# Patient Record
Sex: Male | Born: 1985 | Race: White | Hispanic: No | Marital: Single | Smoking: Light tobacco smoker
Health system: Southern US, Community
[De-identification: ages and names within clinical notes are randomized; demographics above are authoritative.]

## PROBLEM LIST (undated history)

## (undated) DIAGNOSIS — B3322 Viral myocarditis: Secondary | ICD-10-CM

## (undated) DIAGNOSIS — Z0389 Encounter for observation for other suspected diseases and conditions ruled out: Secondary | ICD-10-CM

## (undated) DIAGNOSIS — R7989 Other specified abnormal findings of blood chemistry: Secondary | ICD-10-CM

## (undated) DIAGNOSIS — I43 Cardiomyopathy in diseases classified elsewhere: Secondary | ICD-10-CM

## (undated) HISTORY — DX: Cardiomyopathy in diseases classified elsewhere: I43

## (undated) HISTORY — DX: Viral myocarditis: B33.22

## (undated) HISTORY — DX: Other specified abnormal findings of blood chemistry: R79.89

## (undated) HISTORY — DX: Encounter for observation for other suspected diseases and conditions ruled out: Z03.89

---

## 2014-02-26 ENCOUNTER — Encounter (HOSPITAL_COMMUNITY): Payer: Self-pay | Admitting: Internal Medicine

## 2014-02-26 ENCOUNTER — Inpatient Hospital Stay (HOSPITAL_COMMUNITY)
Admission: AD | Admit: 2014-02-26 | Discharge: 2014-02-28 | DRG: 287 | Disposition: A | Discharge status: Home / Self Care | Payer: BC Managed Care – PPO | Source: Other Acute Inpatient Hospital | Attending: Cardiology | Admitting: Cardiology

## 2014-02-26 DIAGNOSIS — I340 Nonrheumatic mitral (valve) insufficiency: Secondary | ICD-10-CM | POA: Diagnosis present

## 2014-02-26 DIAGNOSIS — R079 Chest pain, unspecified: Secondary | ICD-10-CM | POA: Diagnosis present

## 2014-02-26 DIAGNOSIS — I400 Infective myocarditis: Secondary | ICD-10-CM | POA: Diagnosis present

## 2014-02-26 DIAGNOSIS — R7989 Other specified abnormal findings of blood chemistry: Secondary | ICD-10-CM

## 2014-02-26 DIAGNOSIS — B3322 Viral myocarditis: Secondary | ICD-10-CM | POA: Diagnosis present

## 2014-02-26 DIAGNOSIS — I514 Myocarditis, unspecified: Secondary | ICD-10-CM

## 2014-02-26 HISTORY — DX: Viral myocarditis: B33.22

## 2014-02-26 LAB — CBC WITH DIFFERENTIAL/PLATELET
BASOS PCT: 0 % (ref 0–1)
Basophils Absolute: 0 10*3/uL (ref 0.0–0.1)
EOS ABS: 0.2 10*3/uL (ref 0.0–0.7)
EOS PCT: 3 % (ref 0–5)
HEMATOCRIT: 42.9 % (ref 39.0–52.0)
HEMOGLOBIN: 15.1 g/dL (ref 13.0–17.0)
LYMPHS ABS: 2.4 10*3/uL (ref 0.7–4.0)
LYMPHS PCT: 35 % (ref 12–46)
MCH: 32 pg (ref 26.0–34.0)
MCHC: 35.2 g/dL (ref 30.0–36.0)
MCV: 90.9 fL (ref 78.0–100.0)
Monocytes Absolute: 0.4 10*3/uL (ref 0.1–1.0)
Monocytes Relative: 6 % (ref 3–12)
NEUTROS ABS: 3.9 10*3/uL (ref 1.7–7.7)
Neutrophils Relative %: 56 % (ref 43–77)
Platelets: 261 10*3/uL (ref 150–400)
RBC: 4.72 MIL/uL (ref 4.22–5.81)
RDW: 12 % (ref 11.5–15.5)
WBC: 7 10*3/uL (ref 4.0–10.5)

## 2014-02-26 LAB — COMPREHENSIVE METABOLIC PANEL
ALK PHOS: 101 U/L (ref 39–117)
ALT: 26 U/L (ref 0–53)
AST: 30 U/L (ref 0–37)
Albumin: 3.9 g/dL (ref 3.5–5.2)
Anion gap: 8 (ref 5–15)
BILIRUBIN TOTAL: 0.4 mg/dL (ref 0.3–1.2)
BUN: 23 mg/dL (ref 6–23)
CO2: 28 mmol/L (ref 19–32)
CREATININE: 1.16 mg/dL (ref 0.50–1.35)
Calcium: 9.2 mg/dL (ref 8.4–10.5)
Chloride: 104 mEq/L (ref 96–112)
GFR calc Af Amer: >90 mL/min (ref >90)
GFR, EST NON AFRICAN AMERICAN: 85 mL/min — AB (ref >90)
GLUCOSE: 111 mg/dL — AB (ref 70–99)
Potassium: 3.8 mmol/L (ref 3.5–5.1)
Sodium: 140 mmol/L (ref 135–145)
Total Protein: 6.8 g/dL (ref 6.0–8.3)

## 2014-02-26 LAB — PROTIME-INR
INR: 1.03 (ref 0.00–1.49)
Prothrombin Time: 13.6 seconds (ref 11.6–15.2)

## 2014-02-26 LAB — TROPONIN I: Troponin I: 2.77 ng/mL (ref <0.031)

## 2014-02-26 LAB — BRAIN NATRIURETIC PEPTIDE: B Natriuretic Peptide: 18 pg/mL (ref 0.0–100.0)

## 2014-02-26 MED ORDER — SODIUM CHLORIDE 0.9 % IJ SOLN
3.0000 mL | Freq: Two times a day (BID) | INTRAMUSCULAR | Status: DC
  Administered 2014-02-26 – 2014-02-27 (×2): 3 mL via INTRAVENOUS

## 2014-02-26 MED ORDER — ZOLPIDEM TARTRATE 5 MG PO TABS: 5.0000 mg | ORAL_TABLET | Freq: Every evening | ORAL | Status: DC | PRN

## 2014-02-26 MED ORDER — ACETAMINOPHEN 325 MG PO TABS
650.0000 mg | ORAL_TABLET | ORAL | Status: DC | PRN
  Administered 2014-02-27: 650 mg via ORAL
  Filled 2014-02-26: qty 2

## 2014-02-26 MED ORDER — IBUPROFEN 600 MG PO TABS: 600.0000 mg | ORAL_TABLET | Freq: Three times a day (TID) | ORAL | Status: DC

## 2014-02-26 MED ORDER — SODIUM CHLORIDE 0.9 % IJ SOLN: 3.0000 mL | INTRAMUSCULAR | Status: DC | PRN

## 2014-02-26 MED ORDER — SODIUM CHLORIDE 0.9 % IV SOLN: 250.0000 mL | INTRAVENOUS | Status: DC | PRN

## 2014-02-26 MED ORDER — ONDANSETRON HCL 4 MG/2ML IJ SOLN: 4.0000 mg | Freq: Four times a day (QID) | INTRAMUSCULAR | Status: DC | PRN

## 2014-02-26 NOTE — Progress Notes (Signed)
Pt's troponin 2.77. Pt resting in bed and denies any CP. MD on call made aware. Will cont to monitor pt.

## 2014-02-26 NOTE — H&P (Signed)
    CARDIOLOGY ADMISSION NOTE  Assessment and Plan:   *Myocarditis: 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 Of note, patient endorses an episode of likely viral URI about 1 week ago. It is assuring that patient is currently NYHA Class 0 and has no evidence of volume overload.  -- Will cycle CE x 3 or trending down and PRO-BNP -- Monitor on tele -- Get a TTE in AM -- Consider getting cardiac MRI to rule out infiltrative processes should there be evidence of cardiomyopathy. -- Will get ANA, Coxsackie virus, HIV, RPR, hepatitis viral serologies, respiratory viral panel.  -- Avoid NSAIDs, exercise and cardiotoxic medications for now.   *FEN/GI:  -- No IVF. -- Regular diet -- No indications for GI or DVT prophylaxis  *Full Code  Chief complaint: chest pain   HPI:  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 Patient first was diagnosed with myocarditis 7 years ago after having likely a gastroenteritis. He had "some damage" to his heart at the time; although, patient is unaware of the details. He required reportedly a brief admission at the time and had NYHA class 0 functional status afterwards. He presented at the time with heavy heartbeats, which restarted about 2 days ago. He endorses mild chest pressure with every heartbeat since yesterday. Due to persistent symptoms throughout the night and today, patient went to an urgent care who transferred the [facility] to [facility] where was found to have elevated troponins to 3.03.  Besides the symptoms mentioned above, patient denies any SOB, DOE, orthopnea, LE edema, palpitations. Of note, about a week ago, patient had symptoms of upper respiratory infection with runny nose, sore throat and mild cough,  which resolved after taking Dayquil and vitamins.   He also endorses 2 other episodes of similar symptoms for which he took full dose aspirin/ibuprofen more than 1 year ago.   Cardiac history:  Myocarditis 2008 in [place] Previous cardiac imaging  EKG: pending  TTE: unclear   NM Stress test: None  Prior cath:  None  Allergies: NKDA  Social History He is single. He has a GF and a son. He endorses social smoking and drinking. No recent travels. He denies any drug use.   Family History Father with MIs Mother with HTN No family history of SCD  Physical Exam Filed Vitals:   02/26/14 1957  BP: 124/88  Pulse: 71  Temp: 97.7 F (36.5 C)  Resp: 20    Gen: comfortable in NAD. HEENT: EOMI, MMM. No evidence to conjunctivitis, no oropharyngeal erythema Neck: Supple, no JVD, CV: RRR, Norma S1,S2. No murmurs. No S3/S4, +2 pulses in bilateral UE Pulm: CTAB, Nl WOB Abdomen: Soft, NT/ND Ext: No c/c/e  Labs:  [facility] Labs Chem: 138 / 3.9 / 100/ 31 / Cr 1.22 WBC: 7.5 with 3.2% eosinophils Trop 3.03 CXR: Clear lung fields

## 2014-02-27 ENCOUNTER — Encounter (HOSPITAL_COMMUNITY): Payer: Self-pay

## 2014-02-27 ENCOUNTER — Encounter (HOSPITAL_COMMUNITY)
Admission: AD | Disposition: A | Discharge status: Home / Self Care | Payer: Self-pay | Source: Other Acute Inpatient Hospital | Attending: Cardiology

## 2014-02-27 DIAGNOSIS — R9431 Abnormal electrocardiogram [ECG] [EKG]: Secondary | ICD-10-CM

## 2014-02-27 DIAGNOSIS — R072 Precordial pain: Secondary | ICD-10-CM

## 2014-02-27 DIAGNOSIS — I059 Rheumatic mitral valve disease, unspecified: Secondary | ICD-10-CM

## 2014-02-27 HISTORY — PX: LEFT HEART CATHETERIZATION WITH CORONARY ANGIOGRAM: SHX5451

## 2014-02-27 LAB — BASIC METABOLIC PANEL
ANION GAP: 6 (ref 5–15)
Anion gap: 7 (ref 5–15)
BUN: 20 mg/dL (ref 6–23)
BUN: 15 mg/dL (ref 6–23)
CALCIUM: 9 mg/dL (ref 8.4–10.5)
CHLORIDE: 103 meq/L (ref 96–112)
CO2: 28 mmol/L (ref 19–32)
CO2: 31 mmol/L (ref 19–32)
CREATININE: 1.18 mg/dL (ref 0.50–1.35)
Calcium: 9.1 mg/dL (ref 8.4–10.5)
Chloride: 104 mEq/L (ref 96–112)
Creatinine, Ser: 1.02 mg/dL (ref 0.50–1.35)
GFR calc non Af Amer: 83 mL/min — ABNORMAL LOW (ref >90)
GLUCOSE: 106 mg/dL — AB (ref 70–99)
GLUCOSE: 112 mg/dL — AB (ref 70–99)
Potassium: 4 mmol/L (ref 3.5–5.1)
Potassium: 4.1 mmol/L (ref 3.5–5.1)
SODIUM: 139 mmol/L (ref 135–145)
Sodium: 140 mmol/L (ref 135–145)

## 2014-02-27 LAB — CK TOTAL AND CKMB (NOT AT ARMC)
CK, MB: 11.9 ng/mL (ref 0.3–4.0)
Relative Index: 4.3 — ABNORMAL HIGH (ref 0.0–2.5)
Total CK: 277 U/L — ABNORMAL HIGH (ref 7–232)

## 2014-02-27 LAB — TSH: TSH: 1.869 u[IU]/mL (ref 0.350–4.500)

## 2014-02-27 LAB — SEDIMENTATION RATE: Sed Rate: 4 mm/hr (ref 0–16)

## 2014-02-27 LAB — TROPONIN I
Troponin I: 2.87 ng/mL (ref <0.031)
Troponin I: 1.84 ng/mL (ref <0.031)

## 2014-02-27 SURGERY — LEFT HEART CATHETERIZATION WITH CORONARY ANGIOGRAM

## 2014-02-27 MED ORDER — MIDAZOLAM HCL 2 MG/2ML IJ SOLN
INTRAMUSCULAR | Status: AC
  Filled 2014-02-27: qty 2

## 2014-02-27 MED ORDER — ASPIRIN EC 81 MG PO TBEC
81.0000 mg | DELAYED_RELEASE_TABLET | Freq: Every day | ORAL | Status: DC
  Administered 2014-02-27: 81 mg via ORAL
  Filled 2014-02-27 (×2): qty 1

## 2014-02-27 MED ORDER — HEPARIN (PORCINE) IN NACL 2-0.9 UNIT/ML-% IJ SOLN
INTRAMUSCULAR | Status: AC
  Filled 2014-02-27: qty 1500

## 2014-02-27 MED ORDER — ONDANSETRON HCL 4 MG/2ML IJ SOLN: 4.0000 mg | Freq: Four times a day (QID) | INTRAMUSCULAR | Status: DC | PRN

## 2014-02-27 MED ORDER — SODIUM CHLORIDE 0.9 % IJ SOLN: 3.0000 mL | INTRAMUSCULAR | Status: DC | PRN

## 2014-02-27 MED ORDER — ACETAMINOPHEN 325 MG PO TABS: 650.0000 mg | ORAL_TABLET | ORAL | Status: DC | PRN

## 2014-02-27 MED ORDER — FENTANYL CITRATE 0.05 MG/ML IJ SOLN
INTRAMUSCULAR | Status: AC
  Filled 2014-02-27: qty 2

## 2014-02-27 MED ORDER — LISINOPRIL 2.5 MG PO TABS
2.5000 mg | ORAL_TABLET | Freq: Every day | ORAL | Status: DC
  Administered 2014-02-28: 2.5 mg via ORAL
  Filled 2014-02-27: qty 1

## 2014-02-27 MED ORDER — VERAPAMIL HCL 2.5 MG/ML IV SOLN
INTRAVENOUS | Status: AC
  Filled 2014-02-27: qty 2

## 2014-02-27 MED ORDER — HEPARIN (PORCINE) IN NACL 100-0.45 UNIT/ML-% IJ SOLN: 1000.0000 [IU]/h | INTRAMUSCULAR | Status: DC

## 2014-02-27 MED ORDER — SODIUM CHLORIDE 0.9 % IV SOLN: 250.0000 mL | INTRAVENOUS | Status: DC | PRN

## 2014-02-27 MED ORDER — HEPARIN SODIUM (PORCINE) 1000 UNIT/ML IJ SOLN
INTRAMUSCULAR | Status: AC
  Filled 2014-02-27: qty 1

## 2014-02-27 MED ORDER — SODIUM CHLORIDE 0.9 % IJ SOLN: 3.0000 mL | Freq: Two times a day (BID) | INTRAMUSCULAR | Status: DC

## 2014-02-27 MED ORDER — LISINOPRIL 2.5 MG PO TABS: 2.5000 mg | ORAL_TABLET | Freq: Every day | ORAL | Status: DC

## 2014-02-27 MED ORDER — LIDOCAINE HCL (PF) 1 % IJ SOLN
INTRAMUSCULAR | Status: AC
Start: 2014-02-27 — End: 2014-02-27
  Filled 2014-02-27: qty 30

## 2014-02-27 MED ORDER — ASPIRIN 81 MG PO CHEW
81.0000 mg | CHEWABLE_TABLET | ORAL | Status: DC
Start: 2014-02-28 — End: 2014-02-27

## 2014-02-27 MED ORDER — SODIUM CHLORIDE 0.9 % IV SOLN: INTRAVENOUS | Status: DC

## 2014-02-27 MED ORDER — SODIUM CHLORIDE 0.9 % IV SOLN
INTRAVENOUS | Status: DC
  Administered 2014-02-27: 14:00:00 via INTRAVENOUS

## 2014-02-27 MED ORDER — NITROGLYCERIN 1 MG/10 ML FOR IR/CATH LAB
INTRA_ARTERIAL | Status: AC
  Filled 2014-02-27: qty 10

## 2014-02-27 NOTE — Progress Notes (Addendum)
ANTICOAGULATION CONSULT NOTE - Initial Consult  Pharmacy Consult for Heparin Indication: chest pain/ACS  Not on File  Patient Measurements: Height: 6' (182.9 cm) Weight: 206 lb (93.441 kg) IBW/kg (Calculated) : 77.6 Heparin Dosing Weight: 96 (use TBW 93.4)  Vital Signs: Temp: 97.7 F (36.5 C) (01/01 0525) BP: 124/84 mmHg (01/01 0525) Pulse Rate: 73 (01/01 1215)  Labs:  Recent Labs  02/26/14 2146 02/27/14 0228 02/27/14 0840  HGB 15.1  --   --   HCT 42.9  --   --   PLT 261  --   --   LABPROT 13.6  --   --   INR 1.03  --   --   CREATININE 1.16 1.02  --   CKTOTAL  --   --  277*  CKMB  --   --  11.9*  TROPONINI 2.77* 2.87* 1.84*    Estimated Creatinine Clearance: 129.1 mL/min (by C-G formula based on Cr of 1.02).   Medical History: History reviewed. No pertinent past medical history.  Assessment: Pharmacy consulted to dose heparin in this 29 year old male for ACS prior to cath. Per MD, patient expected to go to cath in the next 45 minutes to 2 hours. MD wanted to give 1000 units/hr infusion with no bolus.  Called to confirm with MD. Hgb and platelets are stable.  MD will likely discontinue heparin after cath.  Goal of Therapy:  Heparin level 0.3-0.7 units/ml Monitor platelets by anticoagulation protocol: Yes   Plan:  Heparin 1000 units/hr, No bolus Follow-up plans for anticoagulation after cath if needed.  Russ Halo, PharmD Clinical Pharmacist - Resident Pager: [phone] 1/1/20161:53 PM    Addendum:  Per MD, discontinue heparin.  Cath WNL.  By looking at the [facility], it appears the heparin that I ordered was never hung. Patient was sent to cath around time of dose being ordered.

## 2014-02-27 NOTE — H&P (View-Only) (Signed)
I have reviewed echo  LVEF is approximately 45% with hypokinesis/akiensis of the disltal lateral, distal posterior and apical walls.  No pericardial effusion. Patient with minimal chest discomfort EKG with ST elevation V4 to V6, I, minimal L, III  I have reviewed case / findings with T Kelly  With above changes, would recomm ruling out coronary artery abnormality with cath  I have discussed risks/benefits with patient  He understands and agrees to proceed.  Heparin started with no bolus  ASA to be given.

## 2014-02-27 NOTE — Progress Notes (Signed)
CRITICAL VALUE ALERT  Critical value received:  CK, MB and Troponin  Date of notification:  02/27/2014  Time of notification:  11:10  Critical value read back: yes  Nurse who received alert:  Tommy Medal  MD notified (1st page):  Pricilla Riffle, MD  Time of first page:  11:10  MD notified (2nd page):  Time of second page:  Responding MD:  Pricilla Riffle, MD  Time MD responded:  11:10

## 2014-02-27 NOTE — Progress Notes (Signed)
Pt troponin 2.87. Pt resting in bed and denies any cp. Md on call paged. Will cont to monitor pt.

## 2014-02-27 NOTE — Progress Notes (Signed)
Cath with no evid for CAD or coronary anomaly. I have contacted ID for recomm on other serologies   For now D/C heparin In AM would add ACE I   Follow BP.   Possible MRI next week.  Could be done as outpatinet.

## 2014-02-27 NOTE — Progress Notes (Signed)
Echocardiogram 2D Echocardiogram has been performed.  Dorothey Baseman 02/27/2014, 10:05 AM

## 2014-02-27 NOTE — Progress Notes (Addendum)
Subjective: No SOB  Mild chest discomfort persists   Objective: Filed Vitals:   02/26/14 1957 02/27/14 0525  BP: 124/88 124/84  Pulse: 71   Temp: 97.7 F (36.5 C) 97.7 F (36.5 C)  TempSrc: Oral   Resp: 20 20  Height: 6' (1.829 m)   Weight: 212 lb (96.163 kg) 206 lb (93.441 kg)  SpO2: 98% 99%   Weight change:  No intake or output data in the 24 hours ending 02/27/14 0743  General: Alert, awake, oriented x3, in no acute distress Neck:  JVP is normal Heart: Regular rate and rhythm, without murmurs, rubs, gallops.  Lungs: Clear to auscultation.  No rales or wheezes. Exemities:  No edema.   Neuro: Grossly intact, nonfocal.  Tele  SR   Lab Results: Results for orders placed or performed during the hospital encounter of 02/26/14 (from the past 24 hour(s))  CBC WITH DIFFERENTIAL     Status: None   Collection Time: 02/26/14  9:46 PM  Result Value Ref Range   WBC 7.0 4.0 - 10.5 K/uL   RBC 4.72 4.22 - 5.81 MIL/uL   Hemoglobin 15.1 13.0 - 17.0 g/dL   HCT 16.1 09.6 - 04.5 %   MCV 90.9 78.0 - 100.0 fL   MCH 32.0 26.0 - 34.0 pg   MCHC 35.2 30.0 - 36.0 g/dL   RDW 40.9 81.1 - 91.4 %   Platelets 261 150 - 400 K/uL   Neutrophils Relative % 56 43 - 77 %   Neutro Abs 3.9 1.7 - 7.7 K/uL   Lymphocytes Relative 35 12 - 46 %   Lymphs Abs 2.4 0.7 - 4.0 K/uL   Monocytes Relative 6 3 - 12 %   Monocytes Absolute 0.4 0.1 - 1.0 K/uL   Eosinophils Relative 3 0 - 5 %   Eosinophils Absolute 0.2 0.0 - 0.7 K/uL   Basophils Relative 0 0 - 1 %   Basophils Absolute 0.0 0.0 - 0.1 K/uL  Comprehensive metabolic panel     Status: Abnormal   Collection Time: 02/26/14  9:46 PM  Result Value Ref Range   Sodium 140 135 - 145 mmol/L   Potassium 3.8 3.5 - 5.1 mmol/L   Chloride 104 96 - 112 mEq/L   CO2 28 19 - 32 mmol/L   Glucose, Bld 111 (H) 70 - 99 mg/dL   BUN 23 6 - 23 mg/dL   Creatinine, Ser 7.82 0.50 - 1.35 mg/dL   Calcium 9.2 8.4 - 95.6 mg/dL   Total Protein 6.8 6.0 - 8.3 g/dL   Albumin 3.9  3.5 - 5.2 g/dL   AST 30 0 - 37 U/L   ALT 26 0 - 53 U/L   Alkaline Phosphatase 101 39 - 117 U/L   Total Bilirubin 0.4 0.3 - 1.2 mg/dL   GFR calc non Af Amer 85 (L) >90 mL/min   GFR calc Af Amer >90 >90 mL/min   Anion gap 8 5 - 15  Protime-INR     Status: None   Collection Time: 02/26/14  9:46 PM  Result Value Ref Range   Prothrombin Time 13.6 11.6 - 15.2 seconds   INR 1.03 0.00 - 1.49  Brain natriuretic peptide     Status: None   Collection Time: 02/26/14  9:46 PM  Result Value Ref Range   B Natriuretic Peptide 18.0 0.0 - 100.0 pg/mL  Troponin I     Status: Abnormal   Collection Time: 02/26/14  9:46 PM  Result Value Ref Range  Troponin I 2.77 (HH) <0.031 ng/mL  TSH     Status: None   Collection Time: 02/26/14 10:44 PM  Result Value Ref Range   TSH 1.869 0.350 - 4.500 uIU/mL  Basic metabolic panel     Status: Abnormal   Collection Time: 02/27/14  2:28 AM  Result Value Ref Range   Sodium 139 135 - 145 mmol/L   Potassium 4.0 3.5 - 5.1 mmol/L   Chloride 104 96 - 112 mEq/L   CO2 28 19 - 32 mmol/L   Glucose, Bld 106 (H) 70 - 99 mg/dL   BUN 20 6 - 23 mg/dL   Creatinine, Ser 1.61 0.50 - 1.35 mg/dL   Calcium 9.1 8.4 - 09.6 mg/dL   GFR calc non Af Amer >90 >90 mL/min   GFR calc Af Amer >90 >90 mL/min   Anion gap 7 5 - 15  Troponin I     Status: Abnormal   Collection Time: 02/27/14  2:28 AM  Result Value Ref Range   Troponin I 2.87 (HH) <0.031 ng/mL    Studies/Results: No results found.  MedicationsReviewed   @  1.  CP  VEry mild discomfort persists.  Patient gives history of 2 days ago developing chest discomfort.  Says he can feel his heart beating  Present constantly  No exacerbating factors  Mild  SImilar to sensations he had 7 years ago.  He had 2 spells in interval time that were short lived  This episode did not go away so he came in to ER   Had URI right before Xmas Lasted a few days. No pleuritic pain  No Cough  No joint swelling  No rash  No N/V LAbs  signif for troponin elevation 2.87  Plan  Echo this AM   Repeat EKG  EKG early this am with elevation more promin laterally but somewhat diffuse  Patinet should have cardiac MR  Labs pending.  (viral, autoimmune)  LOS: 1 day   Dietrich Pates 02/27/2014, 7:43 AM

## 2014-02-27 NOTE — CV Procedure (Signed)
Roger Sullivan is a 29 y.o. male   [id number]  [id number] LOCATION:  FACILITY: [facility]  PHYSICIAN: Lennette Bihari, MD, [facility] 09-14-1985   DATE OF PROCEDURE:  02/27/2014     CARDIAC CATHETERIZATION    HISTORY:   Roger Sullivan is a 29 year old gentleman who was admitted with chest pain felt possibly due to myocarditis.  Troponin and CPK enzymes are mildly positive.  An ECG today revealed more prominent lateral ST segment changes.  An echo Doppler study showed focal wall motion abnormalities with an ejection fraction of 45%.  He is now referred for definitive cardiac catheterization to assess his coronary anatomy.   PROCEDURE:  Left heart catheterization via the right radial artery approach: Coronary angiography, left ventriculography.  The patient was brought to the second floor [facility] cath lab in the postabsorptive state. The patient was premedicated with Versed 2 mg and fentanyl 25 mcg. A right radial approach was utilized after an Allen's test verified adequate circulation. The right radial artery was punctured via the Seldinger technique, and a 6 [place] Glidesheath Slender was inserted without difficulty.  A radial cocktail consisting of Verapamil, IV nitroglycerin, and lidocaine was administered. Weight adjusted heparin was administered. A safety J wire was advanced into the ascending aorta. Diagnostic catheterization was done with a 5 [place] TIG 4.0 catheter. A 5 French pigtail catheter was used for left ventriculography. A TR radial band was applied for hemostasis. The patient left the catheterization laboratory in stable condition.   HEMODYNAMICS:   Central Aorta: 95/65  Left Ventricle: 95/7  ANGIOGRAPHY:   The left main coronary artery was angiographically normal and trifurcated into the LAD, ramus intermediate and left circumflex coronary artery.   The LAD was angiographically normal and gave rise to 2 major diagonal vessels and several septal perforating arteries.  The vessel extended to the LV apex.   The ramus intermediate artery was moderate size and angiographically normal.  The left circumflex coronary artery was anatomically normal and gave rise to two  obtuse marginal branches.   The RCA was angiographically normal it gave rise to a large PDA and PLA vessel.   Left ventriculography revealed mild global LV dysfunction with slightly more pronounced mid anterolateral hypocontractility and apical anterior hypocontractility.  Ejection fraction is 45-50%.   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  Normal coronary arteries.   RECOMMENDATION:  Viral serologies have been sent.  Infectious disease will be consulted.  He will be started on ACE inhibitor therapy.      Lennette Bihari, MD, [facility] 02/27/2014 1:18 PM

## 2014-02-27 NOTE — Interval H&P Note (Signed)
Cath Lab Visit (complete for each Cath Lab visit)  Clinical Evaluation Leading to the Procedure:   ACS: Yes.    Non-ACS:    Anginal Classification: CCS IV  Anti-ischemic medical therapy: No Therapy  Non-Invasive Test Results: No non-invasive testing performed  Prior CABG: No previous CABG      History and Physical Interval Note:  02/27/2014 12:31 PM  Roger Sullivan  has presented today for surgery, with the diagnosis of Non Stemi  The various methods of treatment have been discussed with the patient and family. After consideration of risks, benefits and other options for treatment, the patient has consented to  Procedure(s): LEFT HEART CATHETERIZATION WITH CORONARY ANGIOGRAM as a surgical intervention .  The patient's history has been reviewed, patient examined, no change in status, stable for surgery.  I have reviewed the patient's chart and labs.  Questions were answered to the patient's satisfaction.     Jeremie Giangrande A

## 2014-02-27 NOTE — Progress Notes (Signed)
I have reviewed echo  LVEF is approximately 45% with hypokinesis/akiensis of the disltal lateral, distal posterior and apical walls.  No pericardial effusion. Patient with minimal chest discomfort EKG with ST elevation V4 to V6, I, minimal L, III  I have reviewed case / findings with T Kelly  With above changes, would recomm ruling out coronary artery abnormality with cath  I have discussed risks/benefits with patient  He understands and agrees to proceed.  Heparin started with no bolus  ASA to be given.   

## 2014-02-28 ENCOUNTER — Other Ambulatory Visit: Payer: Self-pay | Admitting: Physician Assistant

## 2014-02-28 DIAGNOSIS — I409 Acute myocarditis, unspecified: Secondary | ICD-10-CM

## 2014-02-28 DIAGNOSIS — I514 Myocarditis, unspecified: Secondary | ICD-10-CM

## 2014-02-28 LAB — BASIC METABOLIC PANEL
ANION GAP: 6 (ref 5–15)
BUN: 13 mg/dL (ref 6–23)
CHLORIDE: 102 meq/L (ref 96–112)
CO2: 31 mmol/L (ref 19–32)
Calcium: 9.1 mg/dL (ref 8.4–10.5)
Creatinine, Ser: 1.05 mg/dL (ref 0.50–1.35)
GFR calc Af Amer: >90 mL/min (ref >90)
Glucose, Bld: 114 mg/dL — ABNORMAL HIGH (ref 70–99)
Potassium: 3.8 mmol/L (ref 3.5–5.1)
Sodium: 139 mmol/L (ref 135–145)

## 2014-02-28 LAB — TROPONIN I: Troponin I: 1.49 ng/mL (ref <0.031)

## 2014-02-28 MED ORDER — PANTOPRAZOLE SODIUM 40 MG PO TBEC: 40.0000 mg | DELAYED_RELEASE_TABLET | Freq: Every day | ORAL | Status: DC

## 2014-02-28 MED ORDER — IBUPROFEN 600 MG PO TABS
600.0000 mg | ORAL_TABLET | Freq: Four times a day (QID) | ORAL | Status: DC
  Administered 2014-02-28: 600 mg via ORAL
  Filled 2014-02-28: qty 1

## 2014-02-28 MED ORDER — COLCHICINE 0.6 MG PO TABS: 0.6000 mg | ORAL_TABLET | Freq: Two times a day (BID) | ORAL | Status: DC

## 2014-02-28 MED ORDER — COLCHICINE 0.6 MG PO TABS
0.6000 mg | ORAL_TABLET | Freq: Two times a day (BID) | ORAL | Status: DC
  Administered 2014-02-28: 0.6 mg via ORAL
  Filled 2014-02-28: qty 1

## 2014-02-28 MED ORDER — LISINOPRIL 2.5 MG PO TABS: 2.5000 mg | ORAL_TABLET | Freq: Every day | ORAL | Status: DC

## 2014-02-28 MED ORDER — IBUPROFEN 600 MG PO TABS: 600.0000 mg | ORAL_TABLET | Freq: Four times a day (QID) | ORAL | Status: AC

## 2014-02-28 NOTE — Discharge Instructions (Signed)
No lifting over 5 lbs for 1 week. No sexual activity for 1 week. You may return to work on 03/02/2013, however no strenuous activity until cleared by cardiologist. Keep procedure site clean & dry. If you notice increased pain, swelling, bleeding or pus, call/return!  You may shower, but no soaking baths/hot tubs/pools for 1 week.

## 2014-02-28 NOTE — Progress Notes (Signed)
Patient ID: Roger Sullivan, male   DOB: May 26, 1985, 29 y.o.   MRN: [id number]   Subjective: Forceful heart beating no chest pain or dyspnea  Objective: Filed Vitals:   02/27/14 1454 02/27/14 1511 02/27/14 2000 02/28/14 0500  BP: 104/55 104/51 127/68 124/56  Pulse:   54 66  Temp:   97.8 F (36.6 C) 97.5 F (36.4 C)  TempSrc:   Oral Oral  Resp: Height:      Weight:    96.072 kg (211 lb 12.8 oz)  SpO2:   97% 98%   Weight change: -0.091 kg (-3.2 oz)  Intake/Output Summary (Last 24 hours) at 02/28/14 0751 Last data filed at 02/27/14 1730  Gross per 24 hour  Intake    480 ml  Output      0 ml  Net    480 ml    General: Alert, awake, oriented x3, in no acute distress Neck:  JVP is normal Heart: Regular rate and rhythm, without murmurs, rubs, gallops.  Lungs: Clear to auscultation.  No rales or wheezes. Exemities:  No edema.   Neuro: Grossly intact, nonfocal.  Tele  SR   Lab Results: Results for orders placed or performed during the hospital encounter of 02/26/14 (from the past 24 hour(s))  Troponin I     Status: Abnormal   Collection Time: 02/27/14  8:40 AM  Result Value Ref Range   Troponin I 1.84 (HH) <0.031 ng/mL  Sedimentation rate     Status: None   Collection Time: 02/27/14  8:40 AM  Result Value Ref Range   Sed Rate 4 0 - 16 mm/hr  CK total and CKMB (cardiac)     Status: Abnormal   Collection Time: 02/27/14  8:40 AM  Result Value Ref Range   Total CK 277 (H) 7 - 232 U/L   CK, MB 11.9 (HH) 0.3 - 4.0 ng/mL   Relative Index 4.3 (H) 0.0 - 2.5  Basic metabolic panel     Status: Abnormal   Collection Time: 02/27/14  3:37 PM  Result Value Ref Range   Sodium 140 135 - 145 mmol/L   Potassium 4.1 3.5 - 5.1 mmol/L   Chloride 103 96 - 112 mEq/L   CO2 31 19 - 32 mmol/L   Glucose, Bld 112 (H) 70 - 99 mg/dL   BUN 15 6 - 23 mg/dL   Creatinine, Ser 7.84 0.50 - 1.35 mg/dL   Calcium 9.0 8.4 - 69.6 mg/dL   GFR calc non Af Amer 83 (L) >90 mL/min   GFR calc Af  Amer >90 >90 mL/min   Anion gap 6 5 - 15  Basic metabolic panel     Status: Abnormal   Collection Time: 02/28/14  3:45 AM  Result Value Ref Range   Sodium 139 135 - 145 mmol/L   Potassium 3.8 3.5 - 5.1 mmol/L   Chloride 102 96 - 112 mEq/L   CO2 31 19 - 32 mmol/L   Glucose, Bld 114 (H) 70 - 99 mg/dL   BUN 13 6 - 23 mg/dL   Creatinine, Ser 2.95 0.50 - 1.35 mg/dL   Calcium 9.1 8.4 - 28.4 mg/dL   GFR calc non Af Amer >90 >90 mL/min   GFR calc Af Amer >90 >90 mL/min   Anion gap 6 5 - 15  Troponin I     Status: Abnormal   Collection Time: 02/28/14  3:45 AM  Result Value Ref Range   Troponin I 1.49 (HH) <  0.031 ng/mL    Studies/Results: No results found.  MedicationsReviewed   @  Chest Pain: ? Myocarditis no CAD at cath.  Telemetry with no PVC;s or NSVT.  HIV and viral serologies pending.  D/C home with colchicine  Bid and NSAI's.  Needs outpatient cardiac MRI scheduled next week for me to read F/U Dr Tenny Craw as outpatient  Start lisinopril per PR for decreased EF   daily    LOS: 2 days   Charlton Haws 02/28/2014, 7:51 AM

## 2014-02-28 NOTE — Discharge Summary (Signed)
Discharge Summary   Patient ID: Roger Sullivan,  MRN: [id number], DOB/AGE: 1985-12-06 29 y.o.  Admit date: 02/26/2014 Discharge date: 02/28/2014  Primary Care Provider: [facility] Primary Cardiologist: Dr. Tenny Craw  Discharge Diagnoses Active Problems:   Myocarditis   Allergies No Known Allergies  Procedures  Echocardiogram 02/27/2014 LV EF: 45% -  50%  ------------------------------------------------------------------- Indications:   Myocarditis - acute 422.90.  ------------------------------------------------------------------- History:  PMH: No prior cardiac history.  ------------------------------------------------------------------- Study Conclusions  - Left ventricle: global LV strain is -20% LVEF is approximately 45% with hypokinesis of the distal lateral, mid and distal anterolateral and apical walls. The cavity size was normal. Wall thickness was normal. Systolic function was mildly reduced. The estimated ejection fraction was in the range of 45% to 50%. Diffuse hypokinesis. There is severe hypokinesis of the mid-apicalanterolateral myocardium. There is moderate hypokinesis of the apical myocardium. - Aortic valve: Mildly calcified leaflets. - Mitral valve: There was mild regurgitation.    Cardiac catheterization 02/27/2014  HEMODYNAMICS:   Central Aorta: 95/65  Left Ventricle: 95/7  ANGIOGRAPHY:   The left main coronary artery was angiographically normal and trifurcated into the LAD, ramus intermediate and left circumflex coronary artery.   The LAD was angiographically normal and gave rise to 2 major diagonal vessels and several septal perforating arteries. The vessel extended to the LV apex.   The ramus intermediate artery was moderate size and angiographically normal.  The left circumflex coronary artery was anatomically normal and gave rise to two obtuse marginal branches.   The RCA was angiographically normal it gave rise to a  large PDA and PLA vessel.   Left ventriculography revealed mild global LV dysfunction with slightly more pronounced mid anterolateral hypocontractility and apical anterior hypocontractility. Ejection fraction is 45-50%.  IMPRESSION:  Mild LV dysfunction, most likely due to probable viral myocarditis with an ejection fraction of approximate 45-50% with focal wall motion abnormality involving the mid anterolateral wall and apical anterior segment.  Normal coronary arteries.   RECOMMENDATION:  Viral serologies have been sent. Infectious disease will be consulted. He will be started on ACE inhibitor therapy.    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 from [facility] on the night of 12/31 with 2 day onset of chest pain with clinical symptoms suggesting recurrent myocarditis. He endorsed episode of viral URI 1 week ago. It appears, he initially saught medical attention at urgent care for worsening symptom who referred him to [facility] who found to have elevated troponin of 3.03. After transfer to [facility], serial troponin was obtained which showed troponin of 2.77. His troponin subsequently trended up to 2.87 before coming down. ANA and respiratory virus panel have been obtained and is currently pending. TSH was normal.  Echocardiogram showed EF 45% with hypokinesis of distal lateral, mid, and distal anterolateral and apical wall, mild MR. Given wall motion abnormality seen on echo, the case was discussed with interventional team, and it eventually was decided for him to undergo cardiac catheterization to definitively rule out underlying coronary artery disease.  He underwent cardiac cath on 02/27/2014 which showed EF 45-50%, normal coronaries without angiographically significant coronary artery disease, focal wall motion abnormality involving mid anterolateral and  apical anterior segment of the myocardium. Patient's condition was felt to be related to viral myocarditis. He was seen in the morning of 02/28/2014 at which time he denies any significant chest discomfort or shortness  breath. He is deemed stable for discharge from cardiology perspective. I will contact cardiology office to arrange outpatient cardiac MRI to be read by Dr. Eden Emms next week. Patient will follow-up with Dr. Tenny Craw in the clinic. I will add BID dose of colchicine and NSAIDs up discharge. I will also add PPI for GI protection while on NSAID.  Discharge Vitals Blood pressure 124/56, pulse 66, temperature 97.5 F (36.4 C), temperature source Oral, resp. rate 17, height 6' (1.829 m), weight 211 lb 12.8 oz (96.072 kg), SpO2 98 %.  Filed Weights   02/26/14 1957 02/27/14 0525 02/28/14 0500  Weight: 212 lb (96.163 kg) 206 lb (93.441 kg) 211 lb 12.8 oz (96.072 kg)    Labs  CBC  Recent Labs  02/26/14 2146  WBC 7.0  NEUTROABS 3.9  HGB 15.1  HCT 42.9  MCV 90.9  PLT 261   Basic Metabolic Panel  Recent Labs  02/27/14 1537 02/28/14 0345  NA 140 139  K 4.1 3.8  CL 103 102  CO2 31 31  GLUCOSE 112* 114*  BUN 15 13  CREATININE 1.18 1.05  CALCIUM 9.0 9.1   Liver Function Tests  Recent Labs  02/26/14 2146  AST 30  ALT 26  ALKPHOS 101  BILITOT 0.4  PROT 6.8  ALBUMIN 3.9   Cardiac Enzymes  Recent Labs  02/27/14 0228 02/27/14 0840 02/28/14 0345  CKTOTAL  --  277*  --   CKMB  --  11.9*  --   TROPONINI 2.87* 1.84* 1.49*   Thyroid Function Tests  Recent Labs  02/26/14 2244  TSH 1.869    Disposition  Pt is being discharged home today in good condition.  Follow-up Plans & Appointments      Follow-up Information    Follow up with Dietrich Pates, MD.   Specialty:  Cardiology   Why:  Office will contact you to arrange followup with Dr. Tenny Craw after obtain outpatient cardiac MRI   Contact information:   [address] [place]  [address] [phone]       Follow up with [facility].   Specialty:  Cardiology   Why:  Office will contact you to arrange outpatient cardiac MRI next week.    Contact information:   [address], [address] [place] [address] [phone]      Discharge Medications    Medication List    STOP taking these medications        aspirin EC 325 MG tablet      TAKE these medications        colchicine 0.6 MG tablet  Take 1 tablet (0.6 mg total) by mouth 2 (two) times daily.     ibuprofen 600 MG tablet  Commonly known as:  ADVIL,MOTRIN  Take 1 tablet (600 mg total) by mouth 4 (four) times daily.     lisinopril 2.5 MG tablet  Commonly known as:  PRINIVIL,ZESTRIL  Take 1 tablet (2.5 mg total) by mouth daily.     multivitamin with minerals Tabs tablet  Take 1 tablet by mouth daily.        Outstanding Labs/Studies  Outpatient cardiac MRI  Duration of Discharge Encounter   Greater than 30 minutes including physician time.  Ramond Dial PA-C Pager: [phone] 02/28/2014, 8:44 AM

## 2014-03-01 ENCOUNTER — Telehealth: Payer: Self-pay | Admitting: Physician Assistant

## 2014-03-01 LAB — HEPATITIS PANEL, ACUTE
HCV Ab: NEGATIVE
HEP B C IGM: NONREACTIVE
Hep A IgM: NONREACTIVE
Hepatitis B Surface Ag: NEGATIVE

## 2014-03-01 LAB — RPR

## 2014-03-01 LAB — HIV ANTIBODY (ROUTINE TESTING W REFLEX): HIV: NONREACTIVE

## 2014-03-01 NOTE — Telephone Encounter (Signed)
Patient contacted the after hour service line complaining of pain in R arm after recent cath. When I called back, phone line was busy after calling twice. I will attempt again later.   Ramond Dial PA Pager: [phone]

## 2014-03-02 ENCOUNTER — Telehealth: Payer: Self-pay | Admitting: Internal Medicine

## 2014-03-02 LAB — RESPIRATORY VIRUS PANEL
ADENOVIRUS: NOT DETECTED
INFLUENZA A: NOT DETECTED
Influenza A H1: NOT DETECTED
Influenza A H3: NOT DETECTED
Influenza B: NOT DETECTED
Metapneumovirus: NOT DETECTED
PARAINFLUENZA 2 A: NOT DETECTED
PARAINFLUENZA 3 A: NOT DETECTED
Parainfluenza 1: NOT DETECTED
Respiratory Syncytial Virus A: NOT DETECTED
Respiratory Syncytial Virus B: NOT DETECTED
Rhinovirus: NOT DETECTED

## 2014-03-02 LAB — ANA: ANA: NEGATIVE

## 2014-03-02 NOTE — Telephone Encounter (Signed)
Called to follow up with pt. Got recording saying "person you are trying reach cannot be reached at this time".  Unable to leave message.

## 2014-03-02 NOTE — Telephone Encounter (Signed)
New message   Patient called the after hour voice mail on yesterday.   C/o weird feeling in right arm .  Due to recent procedure.

## 2014-03-03 ENCOUNTER — Telehealth: Payer: Self-pay | Admitting: *Deleted

## 2014-03-03 LAB — COXSACKIE B VIRUS ANTIBODIES
Coxsackie B1 Ab: 1:8 {titer} — ABNORMAL HIGH
Coxsackie B2 Ab: 1:16 {titer} — ABNORMAL HIGH
Coxsackie B3 Ab: 1:8 {titer} — ABNORMAL HIGH
Coxsackie B4 Ab: <1:8 {titer}
Coxsackie B6 Ab: 1:16 {titer} — ABNORMAL HIGH

## 2014-03-03 LAB — MYCOPLASMA PNEUMONIAE ANTIBODY, IGM: MYCOPLASMA PNEUMO IGM: 489 U/mL (ref <770)

## 2014-03-03 NOTE — Telephone Encounter (Signed)
PA for Colchicine sent via CoverMyMeds.

## 2014-03-04 ENCOUNTER — Telehealth: Payer: Self-pay | Admitting: Cardiovascular Disease

## 2014-03-04 ENCOUNTER — Encounter: Payer: Self-pay | Admitting: Cardiovascular Disease

## 2014-03-04 LAB — COXSACKIE A VIRUS ANTIBODIES

## 2014-03-04 NOTE — Telephone Encounter (Signed)
Called patient and gave him appt date and time for cardiac MRI at [facility].  Also gave him the address for his 03-09-14 appt with Franky MachoLuke and also phone number.  Mailed MRI letter today.

## 2014-03-04 NOTE — Telephone Encounter (Signed)
Spoke with patient. Right hand / arm neurovascular status intact. Good sensation, color wnl, hand is not cool/cold. Has bruising around cath site, this area is warm to touch.   There is no redness at insertion site. At the time he called in he had felt some occasional tingling to this arm, which has resolved.  He is waiting to get colchicine- it needed prior auth. Received phone call yesterday and was told it was submitted and will take up to 72 hr to hear back. He continues to take ibuprofen.

## 2014-03-05 LAB — ADENOVIRUS ANTIBODIES: Adenovirus Antibody: 1:16 {titer}

## 2014-03-06 ENCOUNTER — Telehealth: Payer: Self-pay | Admitting: Cardiology

## 2014-03-06 NOTE — Telephone Encounter (Signed)
New message      Calling to check the status on the prior authorization on his colchicine.  Please call

## 2014-03-06 NOTE — Telephone Encounter (Signed)
PA for colchicine denied.

## 2014-03-06 NOTE — Telephone Encounter (Signed)
Spoke with patient about denial and that Dr. Jens Somrenshaw has been forwarded the information.

## 2014-03-09 ENCOUNTER — Ambulatory Visit (INDEPENDENT_AMBULATORY_CARE_PROVIDER_SITE_OTHER): Payer: BLUE CROSS/BLUE SHIELD | Admitting: Cardiology

## 2014-03-09 ENCOUNTER — Telehealth: Payer: Self-pay | Admitting: Cardiology

## 2014-03-09 ENCOUNTER — Encounter: Payer: Self-pay | Admitting: Cardiology

## 2014-03-09 VITALS — BP 120/64 | HR 65 | Ht 72.0 in | Wt 209.0 lb

## 2014-03-09 DIAGNOSIS — B3322 Viral myocarditis: Secondary | ICD-10-CM

## 2014-03-09 DIAGNOSIS — R778 Other specified abnormalities of plasma proteins: Secondary | ICD-10-CM | POA: Insufficient documentation

## 2014-03-09 DIAGNOSIS — IMO0001 Reserved for inherently not codable concepts without codable children: Secondary | ICD-10-CM

## 2014-03-09 DIAGNOSIS — I43 Cardiomyopathy in diseases classified elsewhere: Secondary | ICD-10-CM

## 2014-03-09 DIAGNOSIS — Z0389 Encounter for observation for other suspected diseases and conditions ruled out: Secondary | ICD-10-CM

## 2014-03-09 DIAGNOSIS — R7989 Other specified abnormal findings of blood chemistry: Secondary | ICD-10-CM

## 2014-03-09 HISTORY — DX: Other specified abnormalities of plasma proteins: R77.8

## 2014-03-09 HISTORY — DX: Reserved for inherently not codable concepts without codable children: IMO0001

## 2014-03-09 HISTORY — DX: Cardiomyopathy in diseases classified elsewhere: I43

## 2014-03-09 NOTE — Progress Notes (Signed)
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 He has a history of presumed viral pericarditis 7 years ago. He was [facility] on the night of 12/31 with 2 day history of chest pain with clinical symptoms suggesting myocarditis. He endorsed episode of viral URI 1 week ago. It appears, he initially saught medical attention at urgent care for worsening symptom who referred him [facility] who found to have elevated troponin of 3.03. After transfer to [facility], serial troponin was obtained which showed troponin of 2.77. TSH was normal. Echocardiogram showed EF 45% with hypokinesis of distal lateral, mid, and distal anterolateral and apical wall, mild MR. Given wall motion abnormality seen on echo, he underwent cardiac cath on 02/27/2014 which showed EF 45-50%, and normal coronaries with focal wall motion abnormality involving mid anterolateral and apical anterior segment of the myocardium.            He was discharged on NSAIDs and colchicine and is here for follow up. His viral studies were positive for coxsackie virus. ANA and sed rate were WNL. He is scheduled to have a cardiac MRI this week. His insurance company apparently would not cover colchicine. He is doing well since discharge. He denies any unusual dyspnea. 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 He still has some chest discomfort but it improving.  Current Outpatient Prescriptions  Medication Sig Dispense Refill  . ibuprofen (ADVIL,MOTRIN) 600 MG tablet Take 1 tablet (600 mg total) by mouth 4 (four) times daily. 56 tablet 0  . lisinopril (PRINIVIL,ZESTRIL) 2.5 MG tablet Take 1 tablet (2.5 mg total) by mouth daily. 30 tablet 5  . Multiple Vitamin (MULTIVITAMIN WITH MINERALS) TABS tablet Take 1 tablet by mouth daily.    . colchicine 0.6 MG  tablet Take 1 tablet (0.6 mg total) by mouth 2 (two) times daily. (Patient not taking: Reported on 03/09/2014) 60 tablet 2   No current facility-administered medications for this visit.    No Known Allergies  History   Social History  . Marital Status: Single    Spouse Name: N/A    Number of Children: N/A  . Years of Education: N/A   Occupational History  . Not on file.   Social History Main Topics  . Smoking status: Light Tobacco Smoker  . Smokeless tobacco: Never Used     Comment: social, very rare  . Alcohol Use: 0.0 oz/week    0 Not specified per week     Comment: social  . Drug Use: No  . Sexual Activity: Not on file   Other Topics Concern  . Not on file   Social History Narrative     Review of Systems: General: negative for chills, fever, night sweats or weight changes.  Cardiovascular: negative for chest pain, dyspnea on exertion, edema, orthopnea, palpitations, paroxysmal nocturnal dyspnea or shortness of breath Dermatological: negative for rash Respiratory: negative for cough or wheezing Urologic: negative for hematuria Abdominal: negative for nausea, vomiting, diarrhea, bright red blood per rectum, melena, or hematemesis Neurologic: negative for visual changes, syncope, or dizziness All other systems reviewed and are otherwise negative except as noted above.    Blood pressure 120/64, pulse 65, height 6' (1.829 m), weight 209 lb (94.802 kg).  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 F/U with Dr Roger Sullivan after your MRI. Continue NSAIDs.  Roger Sullivan KPA-C 03/09/2014 1:19 PM

## 2014-03-09 NOTE — Assessment & Plan Note (Signed)
Coxsackie AB titers elevated

## 2014-03-09 NOTE — Assessment & Plan Note (Signed)
EF 45-50%.  

## 2014-03-09 NOTE — Assessment & Plan Note (Signed)
Cath 02/27/14

## 2014-03-09 NOTE — Telephone Encounter (Signed)
Patient was seen today.

## 2014-03-09 NOTE — Telephone Encounter (Signed)
New msg         Pt hasn't been seen in the office as of yet, chart shows Dr. Patty SermonsBrackbill may have seen pt in hosp.   Pt calling to inform he may be 10 min late for appt at 11:30 with Corine ShelterLuke Sullivan because of traffic.

## 2014-03-09 NOTE — Patient Instructions (Signed)
Your physician recommends that you continue on your current medications as directed. Please refer to the Current Medication list given to you today.     FOLLOW UP WITH DR ROSS IN 3 TO 4 WEEKS

## 2014-03-12 ENCOUNTER — Ambulatory Visit (HOSPITAL_COMMUNITY)
Admission: RE | Admit: 2014-03-12 | Discharge: 2014-03-12 | Disposition: A | Discharge status: Home / Self Care | Payer: BLUE CROSS/BLUE SHIELD | Source: Ambulatory Visit | Attending: Physician Assistant | Admitting: Physician Assistant

## 2014-03-12 DIAGNOSIS — I409 Acute myocarditis, unspecified: Secondary | ICD-10-CM

## 2014-03-12 DIAGNOSIS — I319 Disease of pericardium, unspecified: Secondary | ICD-10-CM | POA: Insufficient documentation

## 2014-03-12 MED ORDER — GADOBENATE DIMEGLUMINE 529 MG/ML IV SOLN
32.0000 mL | Freq: Once | INTRAVENOUS | Status: AC | PRN
  Administered 2014-03-12: 32 mL via INTRAVENOUS

## 2014-03-13 ENCOUNTER — Other Ambulatory Visit (HOSPITAL_COMMUNITY): Payer: Self-pay

## 2014-04-08 NOTE — Progress Notes (Signed)
Cardiology Office Note   Date:  04/10/2014   ID:  Roger Sullivan, DOB 10/02/1985, MRN [id number]  PCP:  Lorelei PontMAHONEY,MARK, DO  Cardiologist:   Dietrich PatesPaula Tammela Bales, MD   No chief complaint on file.     History of Present Illness: Roger Sullivan is a 29 y.o. male with a history of presumed viral pericarditis 7 years ago. He was [facility] on the night of 12/31 with 2 day history of chest pain with clinical symptoms suggesting myocarditis. He endorsed episode of viral URI 1 week ago. It appears, he initially saught medical attention at urgent care for worsening symptom who referred him [facility] who found to have elevated troponin of 3.03. After transfer to [facility], serial troponin was obtained which showed troponin of 2.77. TSH was normal. Echocardiogram showed EF 45% with hypokinesis of distal lateral, mid, and distal anterolateral and apical wall, mild MR. Given wall motion abnormality seen on echo, he underwent cardiac cath on 02/27/2014 which showed EF 45-50%, and normal coronaries with focal wall motion abnormality involving mid anterolateral and apical anterior segment of the myocardium.  He was discharged on NSAIDs and colchicine and is here for follow up. His viral studies were positive for coxsackie virus. ANA and sed rate were WNL. 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 lisinopril (PRINIVIL,ZESTRIL) 2.5 MG tablet Take 1 tablet (2.5 mg total) by mouth daily. 30 tablet 5  . Multiple Vitamin (MULTIVITAMIN WITH MINERALS) TABS tablet Take 1 tablet by mouth daily.    . colchicine 0.6 MG tablet Take 1 tablet (0.6 mg total) by mouth 2 (two) times daily.  (Patient not taking: Reported on 03/09/2014) 60 tablet 2   No current facility-administered medications for this visit.    Allergies:   Review of patient's allergies indicates no known allergies.   No past medical history on file.  Past Surgical History  Procedure Laterality Date  . Left heart catheterization with coronary angiogram  02/27/2014    Procedure: LEFT HEART CATHETERIZATION WITH CORONARY ANGIOGRAM;  Surgeon: Lennette Biharihomas A Kelly, MD;  [facility] C[facility];  Service: Cardiovascular;;     Social History:  The patient  reports that he has been smoking.  He has never used smokeless tobacco. He reports that he drinks alcohol. He reports that he does not use illicit drugs.   Family History:  The patient's family history includes Cirrhosis in an other family member; Heart attack in an other family member; Hypertension in an other family member; Stroke in an other family member.    ROS:  Please see the history of present illness. All other systems are reviewed and  Negative to the above problem except as noted.    PHYSICAL EXAM: VS:  BP 120/80 mmHg  Pulse 70  Ht 6' (1.829 m)  Wt 212 lb (96.163 kg)  BMI 28.75 kg/m2  SpO2 97%  GEN: Well nourished, well developed, in no acute distress HEENT: normal Neck: no JVD, carotid bruits, or masses Cardiac: RRR; no murmurs, rubs, or gallops,no edema  Respiratory:  clear to auscultation bilaterally, normal work of breathing  GI: soft, nontender, nondistended, + BS  No hepatomegaly  MS: no deformity Moving all extremities   Skin: warm and dry, no rash Neuro:  Strength and sensation are intact Psych: euthymic mood, full affect   EKG:  EKG was not done      Lipid Panel No results found for: CHOL, TRIG, HDL, CHOLHDL, VLDL, LDLCALC, LDLDIRECT    Wt Readings from Last 3 Encounters:  04/10/14 212 lb (96.163 kg)  03/09/14 209 lb (94.802 kg)  02/28/14 211 lb 12.8 oz (96.072 kg)      ASSESSMENT AND PLAN: 1.  Myopericarditis. Patinet has  recovered well  MRI showed normal LV systolic function I will review with ID  No changes for now.  OK to exercise      Current medicines are reviewed at length with the patient today.  The patient does not have concerns re meds    The following changes have been made: N ochanges    Labs/ tests ordered today include  No tests   Disposition:   Will call patient after I have spoken with ID  No f/u set for now but will need to address ACE I  Signed, Dietrich Pates, MD  04/10/2014 10:47 AM    [facility] [address] [place], [place], [place]  [address] Phone: [phone]; Fax: [phone]

## 2014-04-10 ENCOUNTER — Ambulatory Visit (INDEPENDENT_AMBULATORY_CARE_PROVIDER_SITE_OTHER): Payer: BLUE CROSS/BLUE SHIELD | Admitting: Internal Medicine

## 2014-04-10 ENCOUNTER — Encounter: Payer: Self-pay | Admitting: Internal Medicine

## 2014-04-10 VITALS — BP 120/80 | HR 70 | Ht 72.0 in | Wt 212.0 lb

## 2014-04-10 DIAGNOSIS — I309 Acute pericarditis, unspecified: Secondary | ICD-10-CM

## 2014-04-10 NOTE — Patient Instructions (Signed)
Your physician recommends that you schedule a follow-up appointment AS NEEDED WITH DR ROSS.

## 2014-05-08 ENCOUNTER — Telehealth: Payer: Self-pay | Admitting: *Deleted

## 2014-05-08 NOTE — Telephone Encounter (Signed)
Needs appt for Sept     Keep on lisinopril for now.         Message above from Dr. Tenny Crawoss. Left message on voice mail for patient to call to verify he is still taking lisinopril for now.  Recall placed for September.

## 2014-05-28 NOTE — Telephone Encounter (Signed)
Spoke with patient. He is feeling well and has been taking care of himself. He never picked up a refill for the lisinopril after the first prescription.

## 2014-05-30 NOTE — Telephone Encounter (Signed)
Will be available as needed.

## 2014-11-30 ENCOUNTER — Ambulatory Visit: Payer: BLUE CROSS/BLUE SHIELD | Admitting: Internal Medicine

## 2015-01-25 ENCOUNTER — Ambulatory Visit: Payer: BLUE CROSS/BLUE SHIELD | Admitting: Internal Medicine

## 2015-02-05 ENCOUNTER — Telehealth: Payer: Self-pay

## 2015-02-05 ENCOUNTER — Ambulatory Visit (INDEPENDENT_AMBULATORY_CARE_PROVIDER_SITE_OTHER): Payer: BLUE CROSS/BLUE SHIELD | Admitting: Internal Medicine

## 2015-02-05 ENCOUNTER — Encounter: Payer: Self-pay | Admitting: Internal Medicine

## 2015-02-05 VITALS — BP 116/74 | HR 69 | Ht 72.0 in | Wt 205.0 lb

## 2015-02-05 DIAGNOSIS — R0789 Other chest pain: Secondary | ICD-10-CM

## 2015-02-05 DIAGNOSIS — Z8679 Personal history of other diseases of the circulatory system: Secondary | ICD-10-CM | POA: Diagnosis not present

## 2015-02-05 LAB — TROPONIN I

## 2015-02-05 MED ORDER — COLCHICINE 0.6 MG PO CAPS: 0.6000 mg | ORAL_CAPSULE | Freq: Two times a day (BID) | ORAL | Status: DC

## 2015-02-05 MED ORDER — COLCHICINE 0.6 MG PO CAPS: 0.6000 mg | ORAL_CAPSULE | Freq: Two times a day (BID) | ORAL | Status: AC

## 2015-02-05 NOTE — Patient Instructions (Signed)
Your physician has recommended you make the following change in your medication:  1.) colchicine 0.6 mg twice daily  Your physician recommends that you return for lab work in: today (TROPONINS)  Your physician has requested that you have an echocardiogram. Echocardiography is a painless test that uses sound waves to create images of your heart. It provides your doctor with information about the size and shape of your heart and how well your heart's chambers and valves are working. This procedure takes approximately one hour. There are no restrictions for this procedure.  Your physician wants you to follow-up in: 1 year with Dr. Tenny Crawoss.  You will receive a reminder letter in the mail two months in advance. If you don't receive a letter, please call our office to schedule the follow-up appointment.

## 2015-02-05 NOTE — Progress Notes (Signed)
Cardiology Office Note   Date:  02/05/2015   ID:  Roger Sullivan, DOB 05-18-1985, MRN [id number]  PCP:  Lorelei Pont, DO  Cardiologist:   Dietrich Pates, MD   F/U of myopericarditis     History of Present Illness: Roger Sullivan is a 29 y.o. male with a history of recurrent pericarditis.  He was admitted last New years eve with CP  Peak troponin 3  Pt had echo done that showed LVEF 45% with hypokinesis of distal lateral walal, mid/distal anterolateral wall and apex.  Cath was done on 1/1/156  which showed EF 45-50%, and normal coronaries with focal wall motion abnormality involving mid anterolateral and apical anterior segment of the myocardium.   Treated with NSAIDS and colchicine  His viral studies were positive for coxsackie virus. ANA and sed rate were WNL.  MRI done that confirmed myopericarditis   Since he was last in clinic he has done well  He has a URI now and has some mild chest tightness  He says he never knows if he is going to develop pericarditis again.  Now symptoms are mild    No fevers  No chills  No cough       Current Outpatient Prescriptions  Medication Sig Dispense Refill  . Multiple Vitamin (MULTIVITAMIN WITH MINERALS) TABS tablet Take 1 tablet by mouth daily.     No current facility-administered medications for this visit.    Allergies:   Review of patient's allergies indicates no known allergies.   Past Medical History  Diagnosis Date  . Cardiomyopathy in other disease 03/09/2014    EF 45-50%   . Viral myocarditis 02/26/2014  . Elevated troponin 03/09/2014    Secondary to myocarditis- pk 3.0   . Normal coronary arteries 03/09/2014    Cath 02/27/14     Past Surgical History  Procedure Laterality Date  . Left heart catheterization with coronary angiogram  02/27/2014    Procedure: LEFT HEART CATHETERIZATION WITH CORONARY ANGIOGRAM;  Surgeon: Lennette Bihari, MD;  Location: [facility] CATH LAB;  Service: Cardiovascular;;     Social History:  The patient  reports  that he has been smoking.  He has never used smokeless tobacco. He reports that he drinks alcohol. He reports that he does not use illicit drugs.   Family History:  The patient's family history includes Cirrhosis in an other family member; Heart attack in an other family member; Hypertension in an other family member; Stroke in an other family member.    ROS:  Please see the history of present illness. All other systems are reviewed and  Negative to the above problem except as noted.    PHYSICAL EXAM: VS:  BP 116/74 mmHg  Pulse 69  Ht 6' (1.829 m)  Wt 92.987 kg (205 lb)  BMI 27.80 kg/m2  SpO2 98%  GEN: Well nourished, well developed, in no acute distress HEENT: normal Neck: no JVD, carotid bruits, or masses Cardiac: RRR; no murmurs, rubs, or gallops,no edema  Respiratory:  clear to auscultation bilaterally, normal work of breathing GI: soft, nontender, nondistended, + BS  No hepatomegaly  MS: no deformity Moving all extremities   Skin: warm and dry, no rash Neuro:  Strength and sensation are intact Psych: euthymic mood, full affect   EKG:  EKG is ordered today.  SB 58  Nonspecific ST T wave changes     Lipid Panel No results found for: CHOL, TRIG, HDL, CHOLHDL, VLDL, LDLCALC, LDLDIRECT    Wt Readings from Last 3  Encounters:  02/05/15 92.987 kg (205 lb)  04/10/14 96.163 kg (212 lb)  03/09/14 94.802 kg (209 lb)      ASSESSMENT AND PLAN:  1.  Hx myopericarditis  Now wim some mild chest tightness  I am not convinced he is having reactivation.  No rub (though he did not have in past)I would check troponin.If negative I do not think he is having flare.  I have refilled Rx for colchicine if symptoms worsen  Also recomm Aleve  Call if this happens    Otherwise f/u in 1 year      Signed, Dietrich PatesPaula Donneisha Beane, MD  02/05/2015 10:32 AM    [facility] [address], [place], [place]  [address] Phone: [phone]; Fax: [phone]

## 2015-02-05 NOTE — Telephone Encounter (Signed)
[facility] called with stat lab results: Troponin <0.01 Results to [employer]

## 2015-02-19 ENCOUNTER — Ambulatory Visit (HOSPITAL_COMMUNITY): Payer: BLUE CROSS/BLUE SHIELD

## 2016-09-03 IMAGING — MR MR CARD MORPHOLOGY WO/W CM
9 of 10 series · 15 of 16 positions shown · IV contrast (multihance)
Comparison: none

CLINICAL DATA: Myopericarditis

EXAM:
CARDIAC MRI
TECHNIQUE: The patient was scanned on a 1.5 Tesla GE magnet. A dedicated
cardiac coil was used. Functional imaging was done using Fiest[employer]
sequences. [DATE], and 4 chamber views were done to assess for RWMA's.
Modified Hiki rule using a short axis stack was used to
calculate an ejection fraction on a dedicated work station using
Circle software. The patient received 32 cc of Multihance. After 10
minutes inversion recovery sequences were used to assess for
infiltration and scar tissue.
CONTRAST:  32 cc Multihance

[Series 3: bSSFP · sagittal · 8.0mm · 1.37mm/px · 1 of 14 slices shown (1 of 4)]
[im 1/14]
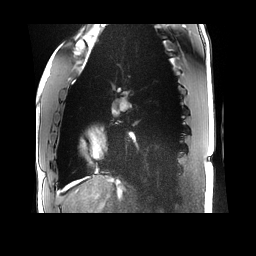

[Series 4: bSSFP · oblique · 8.0mm · 1.37mm/px · 1 of 20 slices shown (2 of 4)]
[im 1/20]
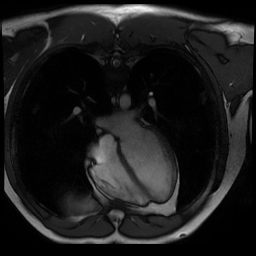

[Series 5: bSSFP · oblique · 8.0mm · 1.37mm/px · 7 of 320 slices shown (3 of 4)]
[im 1/320]
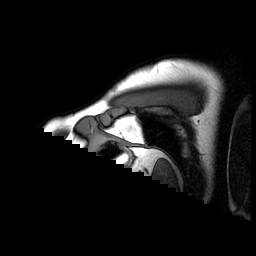
[im 54/320]
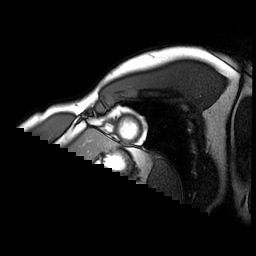
[im 107/320]
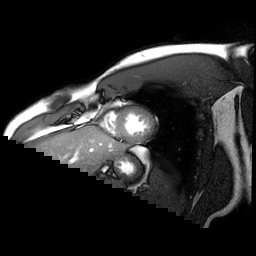
[im 160/320]
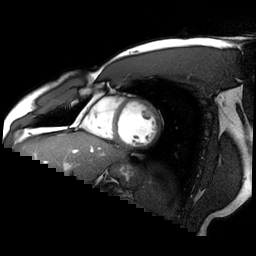
[im 213/320]
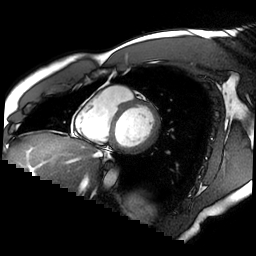
[im 266/320]
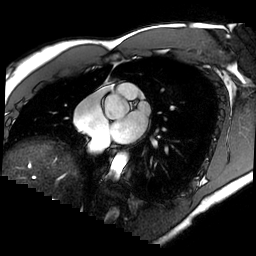
[im 320/320]
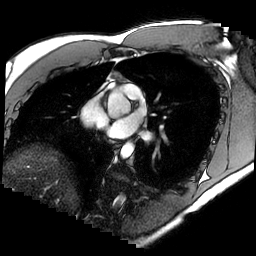

[Series 6: T2 · oblique · 8.0mm · 1.41mm/px · 1 of 60 slices shown]
[im 1/60]
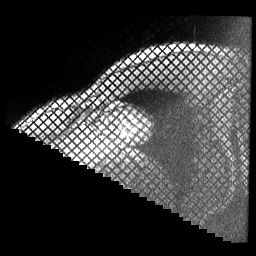

[Series 7: bSSFP · oblique · 8.0mm · 1.41mm/px · 1 of 60 slices shown (4 of 4)]
[im 1/60]
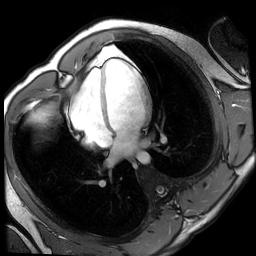

[Series 10: delayed ir prep · oblique · 8.0mm · 1.37mm/px · 1 of 4 slices shown (1 of 3)]
[im 1/4]
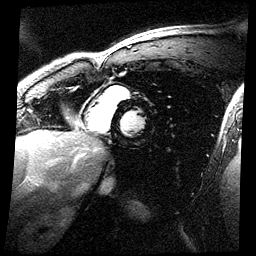

[Series 12: delayed ir prep · oblique · 8.0mm · 1.37mm/px · 1 of 9 slices shown (2 of 3)]
[im 1/9]
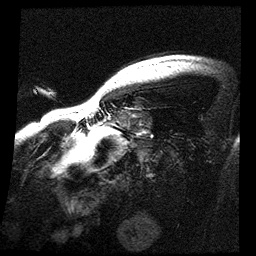

[Series 14: delayed ir prep · oblique · 8.0mm · 1.37mm/px · 1 of 8 slices shown (3 of 3)]
[im 1/8]
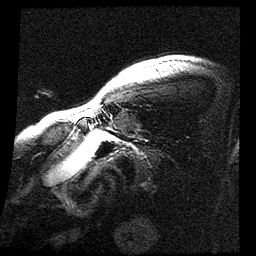

[Series 16: rad delayed ir · oblique · 8.0mm · 1.48mm/px · 1 of 5 slices shown]
[im 1/5]
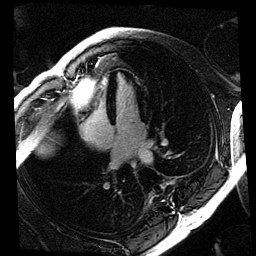

[15 of 16 positions shown; findings below may reference images not displayed]

FINDINGS: LA/RA and RV normal in size and function. Prominent apical
epicardial adipose tissue. No pericardial effusion. LV normal size
and function Quantitative EF 57% (EDV 163 cc, ESV 59 cc SV 94 cc)
Mitral

Aortic and tricuspid valves structurally normal. No ASD/VSD Aortic
root normal 2.5 cm with descending thoracic aorta of 1.6 cm. Post
gadolinium images markedly positive for pericardial uptake along the
anterior, anterior lateral and apical walls. Also myocardial uptake
along the mid and apical anterior wall, apical lateral wall and
septum
IMPRESSION: 1) Findings consistent with myopericarditis with extensive
pericardial uptake along the entire anterior and apical pericardium
Myocardial uptake in the anterior, anterior lateral, apical and
septal walls No pericardial effusion

2) Normal LV size and function EF 57%

3) Normal cardiac valves

4) Normal RV and atria

5) Normal aortic Kavita

Leebert Delrio
